# Patient Record
Sex: Male | Born: 1960 | Race: Black or African American | Hispanic: No | Marital: Single | Smoking: Former smoker
Health system: Southern US, Community
[De-identification: ages and names within clinical notes are randomized; demographics above are authoritative.]

## PROBLEM LIST (undated history)

## (undated) DIAGNOSIS — T7840XA Allergy, unspecified, initial encounter: Secondary | ICD-10-CM

## (undated) DIAGNOSIS — E7800 Pure hypercholesterolemia, unspecified: Secondary | ICD-10-CM

## (undated) DIAGNOSIS — I10 Essential (primary) hypertension: Secondary | ICD-10-CM

## (undated) HISTORY — DX: Pure hypercholesterolemia, unspecified: E78.00

## (undated) HISTORY — DX: Essential (primary) hypertension: I10

## (undated) HISTORY — DX: Allergy, unspecified, initial encounter: T78.40XA

---

## 2002-07-31 ENCOUNTER — Ambulatory Visit (HOSPITAL_COMMUNITY): Admission: RE | Admit: 2002-07-31 | Discharge: 2002-07-31 | Payer: Self-pay | Admitting: Family Medicine

## 2002-07-31 ENCOUNTER — Encounter: Payer: Self-pay | Admitting: Family Medicine

## 2004-01-13 ENCOUNTER — Emergency Department (HOSPITAL_COMMUNITY): Admission: EM | Admit: 2004-01-13 | Discharge: 2004-01-13 | Payer: Self-pay | Admitting: *Deleted

## 2004-03-17 ENCOUNTER — Emergency Department (HOSPITAL_COMMUNITY): Admission: EM | Admit: 2004-03-17 | Discharge: 2004-03-18 | Payer: Self-pay | Admitting: Emergency Medicine

## 2004-03-20 ENCOUNTER — Emergency Department (HOSPITAL_COMMUNITY): Admission: EM | Admit: 2004-03-20 | Discharge: 2004-03-20 | Payer: Self-pay | Admitting: Emergency Medicine

## 2004-03-31 ENCOUNTER — Emergency Department (HOSPITAL_COMMUNITY): Admission: EM | Admit: 2004-03-31 | Discharge: 2004-04-01 | Payer: Self-pay | Admitting: Emergency Medicine

## 2005-11-23 ENCOUNTER — Emergency Department (HOSPITAL_COMMUNITY): Admission: EM | Admit: 2005-11-23 | Discharge: 2005-11-24 | Payer: Self-pay | Admitting: Emergency Medicine

## 2008-06-01 ENCOUNTER — Emergency Department: Payer: Self-pay | Admitting: Internal Medicine

## 2008-08-10 ENCOUNTER — Ambulatory Visit: Payer: Self-pay | Admitting: Family Medicine

## 2009-04-25 ENCOUNTER — Emergency Department (HOSPITAL_COMMUNITY): Admission: EM | Admit: 2009-04-25 | Discharge: 2009-04-26 | Payer: Self-pay | Admitting: Emergency Medicine

## 2010-05-14 ENCOUNTER — Emergency Department (HOSPITAL_COMMUNITY): Admission: EM | Admit: 2010-05-14 | Discharge: 2010-05-15 | Payer: Self-pay | Admitting: Emergency Medicine

## 2010-09-10 ENCOUNTER — Ambulatory Visit: Payer: Self-pay | Admitting: Internal Medicine

## 2010-09-10 ENCOUNTER — Encounter (INDEPENDENT_AMBULATORY_CARE_PROVIDER_SITE_OTHER): Payer: Self-pay | Admitting: Family Medicine

## 2010-09-10 LAB — CONVERTED CEMR LAB
ALT: 14 units/L (ref 0–53)
AST: 20 units/L (ref 0–37)
Albumin: 4.5 g/dL (ref 3.5–5.2)
Alkaline Phosphatase: 90 units/L (ref 39–117)
BUN: 14 mg/dL (ref 6–23)
Basophils Absolute: 0 10*3/uL (ref 0.0–0.1)
Basophils Relative: 0 % (ref 0–1)
CO2: 29 meq/L (ref 19–32)
Calcium: 8.9 mg/dL (ref 8.4–10.5)
Chloride: 104 meq/L (ref 96–112)
Cholesterol: 212 mg/dL — ABNORMAL HIGH (ref 0–200)
Creatinine, Ser: 1.08 mg/dL (ref 0.40–1.50)
Eosinophils Absolute: 0.3 10*3/uL (ref 0.0–0.7)
Eosinophils Relative: 3 % (ref 0–5)
Glucose, Bld: 89 mg/dL (ref 70–99)
HCT: 43.7 % (ref 39.0–52.0)
HDL: 56 mg/dL (ref >39)
Hemoglobin: 14.4 g/dL (ref 13.0–17.0)
LDL Cholesterol: 141 mg/dL — ABNORMAL HIGH (ref 0–99)
Lymphocytes Relative: 34 % (ref 12–46)
Lymphs Abs: 3.1 10*3/uL (ref 0.7–4.0)
MCHC: 33 g/dL (ref 30.0–36.0)
MCV: 77.3 fL — ABNORMAL LOW (ref 78.0–100.0)
Monocytes Absolute: 0.9 10*3/uL (ref 0.1–1.0)
Monocytes Relative: 10 % (ref 3–12)
Neutro Abs: 4.9 10*3/uL (ref 1.7–7.7)
Neutrophils Relative %: 53 % (ref 43–77)
PSA: 0.59 ng/mL (ref 0.10–4.00)
Platelets: 154 10*3/uL (ref 150–400)
Potassium: 4.8 meq/L (ref 3.5–5.3)
RBC: 5.65 M/uL (ref 4.22–5.81)
RDW: 15.5 % (ref 11.5–15.5)
Sodium: 142 meq/L (ref 135–145)
Total Bilirubin: 0.5 mg/dL (ref 0.3–1.2)
Total CHOL/HDL Ratio: 3.8
Total Protein: 7 g/dL (ref 6.0–8.3)
Triglycerides: 76 mg/dL (ref <150)
VLDL: 15 mg/dL (ref 0–40)
WBC: 9.2 10*3/uL (ref 4.0–10.5)

## 2010-09-11 ENCOUNTER — Ambulatory Visit (HOSPITAL_COMMUNITY): Admission: RE | Admit: 2010-09-11 | Discharge: 2010-09-11 | Payer: Self-pay | Admitting: Internal Medicine

## 2011-03-15 LAB — URINE CULTURE
Colony Count: NO GROWTH
Culture: NO GROWTH

## 2011-03-15 LAB — POCT I-STAT, CHEM 8
BUN: 13 mg/dL (ref 6–23)
Calcium, Ion: 0.9 mmol/L — ABNORMAL LOW (ref 1.12–1.32)
Chloride: 104 mEq/L (ref 96–112)
Creatinine, Ser: 1.7 mg/dL — ABNORMAL HIGH (ref 0.4–1.5)
Glucose, Bld: 142 mg/dL — ABNORMAL HIGH (ref 70–99)
HCT: 50 % (ref 39.0–52.0)
Hemoglobin: 17 g/dL (ref 13.0–17.0)
Potassium: 3.6 mEq/L (ref 3.5–5.1)
Sodium: 136 mEq/L (ref 135–145)
TCO2: 20 mmol/L (ref 0–100)

## 2011-03-15 LAB — URINALYSIS, ROUTINE W REFLEX MICROSCOPIC
Bilirubin Urine: NEGATIVE
Glucose, UA: NEGATIVE mg/dL
Ketones, ur: NEGATIVE mg/dL
Leukocytes, UA: NEGATIVE
Nitrite: NEGATIVE
Protein, ur: NEGATIVE mg/dL
Specific Gravity, Urine: 1.01 (ref 1.005–1.030)
Urobilinogen, UA: 0.2 mg/dL (ref 0.0–1.0)
pH: 5.5 (ref 5.0–8.0)

## 2011-03-15 LAB — POCT CARDIAC MARKERS
CKMB, poc: 1.1 ng/mL (ref 1.0–8.0)
Myoglobin, poc: 479 ng/mL (ref 12–200)
Troponin i, poc: <0.05 ng/mL (ref 0.00–0.09)

## 2011-03-15 LAB — RAPID URINE DRUG SCREEN, HOSP PERFORMED
Amphetamines: NOT DETECTED
Barbiturates: NOT DETECTED
Benzodiazepines: NOT DETECTED
Cocaine: POSITIVE — AB
Opiates: NOT DETECTED
Tetrahydrocannabinol: NOT DETECTED

## 2011-03-15 LAB — TYPE AND SCREEN
ABO/RH(D): O POS
Antibody Screen: NEGATIVE

## 2011-03-15 LAB — ABO/RH: ABO/RH(D): O POS

## 2011-03-15 LAB — URINE MICROSCOPIC-ADD ON

## 2011-03-15 LAB — LACTIC ACID, PLASMA
Lactic Acid, Venous: 1.4 mmol/L (ref 0.5–2.2)
Lactic Acid, Venous: 6.9 mmol/L — ABNORMAL HIGH (ref 0.5–2.2)

## 2011-03-15 LAB — CK: Total CK: 554 U/L — ABNORMAL HIGH (ref 7–232)

## 2011-03-15 LAB — ETHANOL: Alcohol, Ethyl (B): 27 mg/dL — ABNORMAL HIGH (ref 0–10)

## 2011-04-06 LAB — ETHANOL: Alcohol, Ethyl (B): <5 mg/dL (ref 0–10)

## 2011-04-06 LAB — HEPATIC FUNCTION PANEL
ALT: 15 U/L (ref 0–53)
AST: 25 U/L (ref 0–37)
Albumin: 3.7 g/dL (ref 3.5–5.2)
Alkaline Phosphatase: 96 U/L (ref 39–117)
Bilirubin, Direct: <0.1 mg/dL (ref 0.0–0.3)
Total Bilirubin: 0.6 mg/dL (ref 0.3–1.2)
Total Protein: 6.5 g/dL (ref 6.0–8.3)

## 2011-04-07 LAB — POCT I-STAT, CHEM 8
Calcium, Ion: 1.16 mmol/L (ref 1.12–1.32)
Chloride: 100 mEq/L (ref 96–112)
Creatinine, Ser: 1.3 mg/dL (ref 0.4–1.5)
Glucose, Bld: 143 mg/dL — ABNORMAL HIGH (ref 70–99)
HCT: 49 % (ref 39.0–52.0)
Potassium: 3.6 mEq/L (ref 3.5–5.1)

## 2011-04-07 LAB — RAPID URINE DRUG SCREEN, HOSP PERFORMED
Amphetamines: NOT DETECTED
Benzodiazepines: NOT DETECTED
Cocaine: POSITIVE — AB
Tetrahydrocannabinol: NOT DETECTED

## 2011-05-05 ENCOUNTER — Emergency Department (HOSPITAL_COMMUNITY)
Admission: EM | Admit: 2011-05-05 | Discharge: 2011-05-06 | Disposition: A | Discharge status: Other Acute Inpatient Hospital | Payer: Self-pay | Attending: Emergency Medicine | Admitting: Emergency Medicine

## 2011-05-05 DIAGNOSIS — R45851 Suicidal ideations: Secondary | ICD-10-CM | POA: Insufficient documentation

## 2011-05-05 DIAGNOSIS — F329 Major depressive disorder, single episode, unspecified: Secondary | ICD-10-CM | POA: Insufficient documentation

## 2011-05-05 DIAGNOSIS — F1910 Other psychoactive substance abuse, uncomplicated: Secondary | ICD-10-CM | POA: Insufficient documentation

## 2011-05-05 DIAGNOSIS — F3289 Other specified depressive episodes: Secondary | ICD-10-CM | POA: Insufficient documentation

## 2011-05-05 LAB — RAPID URINE DRUG SCREEN, HOSP PERFORMED
Amphetamines: NOT DETECTED
Cocaine: POSITIVE — AB
Opiates: NOT DETECTED
Tetrahydrocannabinol: NOT DETECTED

## 2011-05-05 LAB — DIFFERENTIAL
Basophils Absolute: 0 10*3/uL (ref 0.0–0.1)
Basophils Relative: 0 % (ref 0–1)
Eosinophils Absolute: 0.2 10*3/uL (ref 0.0–0.7)
Monocytes Relative: 9 % (ref 3–12)
Neutro Abs: 4.7 10*3/uL (ref 1.7–7.7)
Neutrophils Relative %: 56 % (ref 43–77)

## 2011-05-05 LAB — COMPREHENSIVE METABOLIC PANEL
ALT: 16 U/L (ref 0–53)
AST: 26 U/L (ref 0–37)
Albumin: 3.9 g/dL (ref 3.5–5.2)
CO2: 28 mEq/L (ref 19–32)
Chloride: 103 mEq/L (ref 96–112)
Creatinine, Ser: 0.94 mg/dL (ref 0.4–1.5)
GFR calc Af Amer: >60 mL/min (ref >60)
GFR calc non Af Amer: >60 mL/min (ref >60)
Sodium: 140 mEq/L (ref 135–145)
Total Bilirubin: 0.7 mg/dL (ref 0.3–1.2)

## 2011-05-05 LAB — CBC
Hemoglobin: 14.5 g/dL (ref 13.0–17.0)
MCH: 24.7 pg — ABNORMAL LOW (ref 26.0–34.0)
Platelets: 138 10*3/uL — ABNORMAL LOW (ref 150–400)
RBC: 5.88 MIL/uL — ABNORMAL HIGH (ref 4.22–5.81)
WBC: 8.4 10*3/uL (ref 4.0–10.5)

## 2011-05-06 ENCOUNTER — Inpatient Hospital Stay (HOSPITAL_COMMUNITY)
Admission: AD | Admit: 2011-05-06 | Discharge: 2011-05-11 | DRG: 897 | Disposition: A | Discharge status: Home / Self Care | Payer: PRIVATE HEALTH INSURANCE | Source: Ambulatory Visit | Attending: Psychiatry | Admitting: Psychiatry

## 2011-05-06 DIAGNOSIS — R05 Cough: Secondary | ICD-10-CM

## 2011-05-06 DIAGNOSIS — R45851 Suicidal ideations: Secondary | ICD-10-CM

## 2011-05-06 DIAGNOSIS — J309 Allergic rhinitis, unspecified: Secondary | ICD-10-CM | POA: Diagnosis present

## 2011-05-06 DIAGNOSIS — R748 Abnormal levels of other serum enzymes: Secondary | ICD-10-CM | POA: Diagnosis present

## 2011-05-06 DIAGNOSIS — F1420 Cocaine dependence, uncomplicated: Principal | ICD-10-CM | POA: Diagnosis present

## 2011-05-07 DIAGNOSIS — F1410 Cocaine abuse, uncomplicated: Secondary | ICD-10-CM

## 2011-05-11 ENCOUNTER — Ambulatory Visit (HOSPITAL_COMMUNITY): Payer: PRIVATE HEALTH INSURANCE | Attending: Psychiatry

## 2011-05-11 DIAGNOSIS — R059 Cough, unspecified: Secondary | ICD-10-CM | POA: Insufficient documentation

## 2011-05-11 DIAGNOSIS — I10 Essential (primary) hypertension: Secondary | ICD-10-CM | POA: Insufficient documentation

## 2011-05-11 DIAGNOSIS — R05 Cough: Secondary | ICD-10-CM | POA: Insufficient documentation

## 2011-05-11 DIAGNOSIS — F17200 Nicotine dependence, unspecified, uncomplicated: Secondary | ICD-10-CM | POA: Insufficient documentation

## 2011-05-14 NOTE — Discharge Summary (Signed)
  [name], [name] NO.:  [id number]  MEDICAL RECORD NO.:  [id number]           PATIENT TYPE:  I  LOCATION:  0303                          FACILITY:  [facility]  PHYSICIAN:  Kristopher Jungling, MD  DATE OF BIRTH:  12-Mar-1961  DATE OF ADMISSION:  05/06/2011 DATE OF DISCHARGE:  05/11/2011                              DISCHARGE SUMMARY   IDENTIFYING DATA/REASON FOR ADMISSION:  This was an inpatient psychiatric admission for Kristopher [name], a 50 year old male who was admitted at his own request because of issues of polysubstance dependence, associated with suicidal ideation.  He also had multiple recent stressors.  Please refer to the admission note for further details pertaining to the symptoms, circumstances and history that led to his hospitalization.  He was given an initial Axis I diagnosis of polysubstance dependence.  MEDICAL AND LABORATORY:  The patient was medically and physically assessed by the psychiatric nurse practitioner.  He appeared to be in good health without any active or chronic medical problems.  There were no significant medical issues during his stay.  HOSPITAL COURSE:  The patient was admitted to the adult inpatient psychiatric service.  He presented as a well-nourished, normally- developed adult male who was generally pleasant and cooperative.  He was able to describe with good clarity and articulation the consequences of his substance abuse, including losing his job and concerns regarding addiction.  His primary drug of abuse had been cocaine, so he did not require any medical detoxification.  However, he participated in therapeutic groups and activities geared towards helping him acquire a head start on his recovery process, and these included 12-step groups.  He worked closely with case management towards an aftercare plan.  On hospital day #6, he was appropriate for discharge, having been accepted at the [facility] program further chemical  dependency treatment.  AFTERCARE:  The patient was to go to the [facility] program on the day following admission, May 12, 2011.  The only discharge medication was Claritin 10 mg daily for seasonal allergies.  DISCHARGE DIAGNOSIS:  AXIS I:  Polysubstance dependence. AXIS II:  Deferred. AXIS III:  Seasonal allergies. AXIS IV:  Stressors severe. AXIS V:  GAF on discharge 50.     Kristopher Jungling, MD     SPB/MEDQ  D:  05/13/2011  T:  05/13/2011  Job:  [id number]  Electronically Signed by Geralyn Flash MD on 05/14/2011 08:54:46 AM

## 2011-06-10 NOTE — H&P (Signed)
NAME:  Kristopher [name], Kristopher [name] NO.:  [id number]  MEDICAL RECORD NO.:  [id number]           PATIENT TYPE:  I  LOCATION:  0303                          FACILITY:  [facility]  PHYSICIAN:  Anselm Jungling, MD  DATE OF BIRTH:  26-Sep-1961  DATE OF ADMISSION:  05/06/2011 DATE OF DISCHARGE:                      PSYCHIATRIC ADMISSION ASSESSMENT   IDENTIFYING INFORMATION:  The patient is a 50 year old, African American male who presented to the [facility] emergency room complaining of increased depression and suicidal thoughts secondary to relapse in use of alcohol and cocaine.  PAST PSYCHIATRIC HISTORY:  The patient had been treated in a residential facility in the past, [facility] in 2010.  He also endorses inpatient treatment in [facility] and outpatient treatment in 2011.  He is single, a native of [place].  Recently lost his home and job.  He has no children.  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  He is currently facing probable charges from the [facility] for driving violations.  He has served time in the past for breaking and entering and drug trafficking, where he served 8 to 10 months.  FAMILY PSYCHIATRIC HISTORY:  He endorses first cousin is schizophrenic.  FAMILY ALCOHOL AND DRUG HISTORY:  Father and brother and uncles all drink alcohol.  His brother died from substance-induced coronary artery disease and alcohol.  He has an uncle who has problems with alcohol. The patient endorses his last use of alcohol was 3 to 4 days ago when he drank three 40 ounce beers.  He has been drinking 2 to 3 days a week for the last 5 months.  He says he has been using on average a gram of cocaine every day for the past 4 months, his last use was 4 days ago.  PRIMARY CARE PROVIDER:  None.  MEDICAL PROBLEMS:  He denies any  MEDICATIONS:  None.  DRUG ALLERGIES:  HE IS ALLERGIC TO TB SKIN TEST.  PHYSICAL EXAMINATION:   Examination was done in the [facility] emergency room by PA, Kelvin Cellar, and was unremarkable.  SIGNIFICANT LAB RESULTS:  Include a CBC with diff which indicated a red blood count on the differential was 5.88, MCV is low at 72.8, MCH low at 24.7.  Platelet count is low at 138.  The remainder is normal. Comprehensive metabolic profile indicates glucose of 149 and alkaline phosphatase of 118.  SGOT, SGPT and the remainder of labs were normal. Alcohol level was negative less than 11.  Urine drug screen was positive for cocaine use.  PSYCHIATRIC EXAM:  A well-developed, well-nourished, African American male who appears his chronologically stated age of 50 years old, partially graying hair, currently braided.  He is alert and oriented x4. He does endorse increasing depression with suicidal ideation increasing over the last several days, but none today.  He is cooperative and pleasant in his demeanor.  He makes good eye contact.  His speech is clear, coherent and goal oriented.  Mood is depressed.  Affect is congruent.  He smiles easily and can be quite charming.  Thought process is  normal and linear without evidence of psychosis.  Cognition is at least average.  ASSESSMENT:  Axis I:  Cocaine dependency. Axis II:  Negative. Axis III:  Negative.  Elevated alkaline phosphatase. Axis IV:  Current legal problems, problems with occupation and housing. Axis V:  Current Global Assessment of Functioning 48, past year unknown.  PLAN:  Admission with possible transfer to the mood disorder floor. Estimated length of stay 1-3 days.  Continue collateral information.    ______________________________ Verne Spurr, PA   ______________________________ Anselm Jungling, MD    NM/MEDQ  D:  05/07/2011  T:  05/07/2011  Job:  [id number]  Electronically Signed by Nelly Rout MD on 06/10/2011 11:08:54 AM

## 2012-01-30 IMAGING — US US ABDOMEN COMPLETE
1 series · 14 of 25 positions shown · non-contrast
Comparison: None.

CLINICAL DATA: Hepatomegaly on physical exam.  Alcohol abuse.

ABDOMINAL ULTRASOUND COMPLETE

[Series 1: us abdomen complete · 0.21mm/px · 14 of 80 slices shown]
[im 1/80]
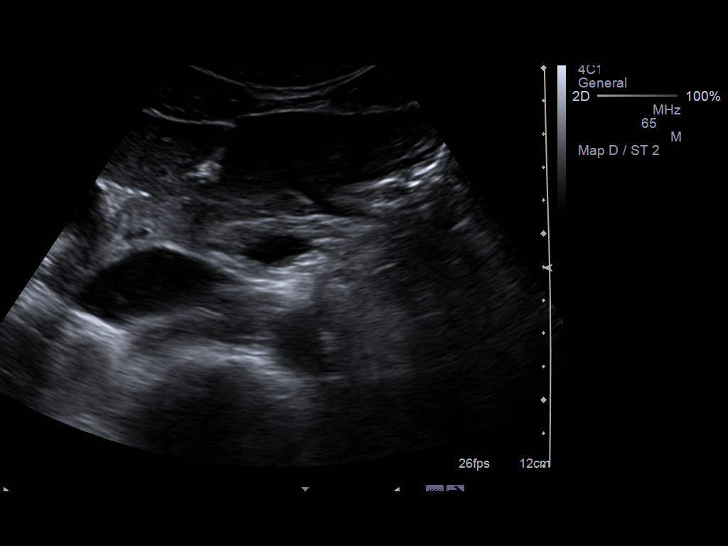
[im 7/80]
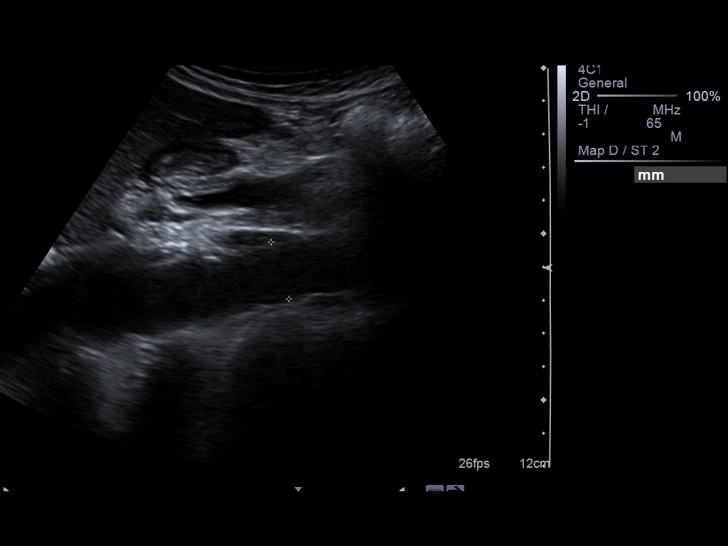
[im 14/80]
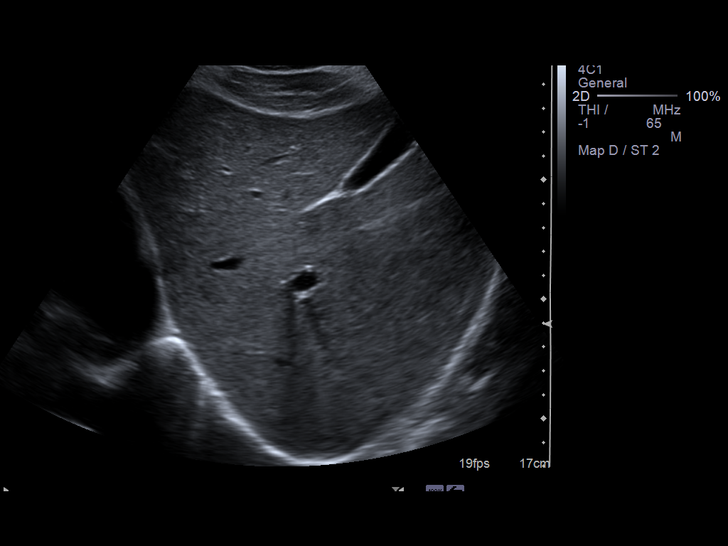
[im 20/80]
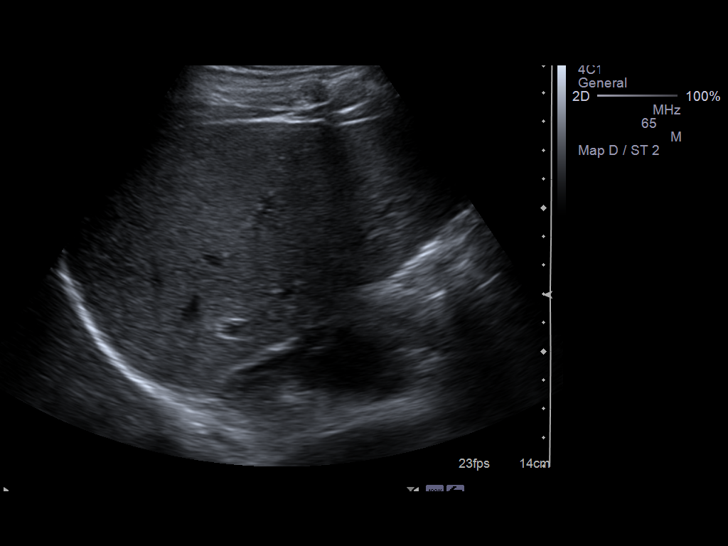
[im 27/80]
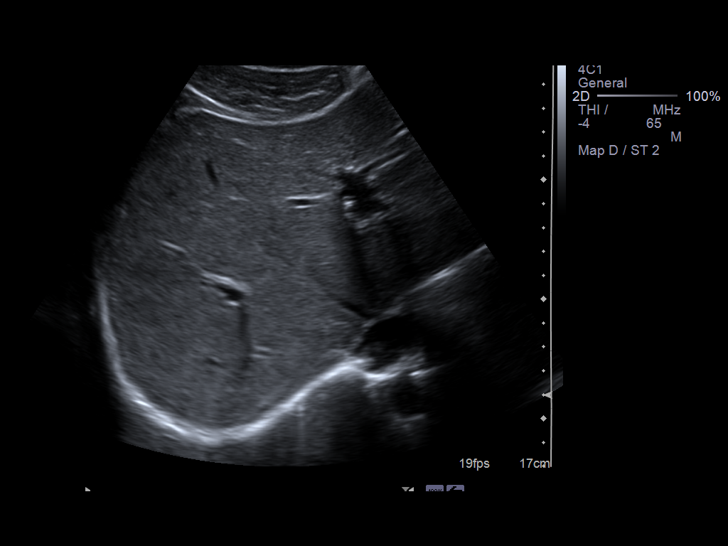
[im 30/80]
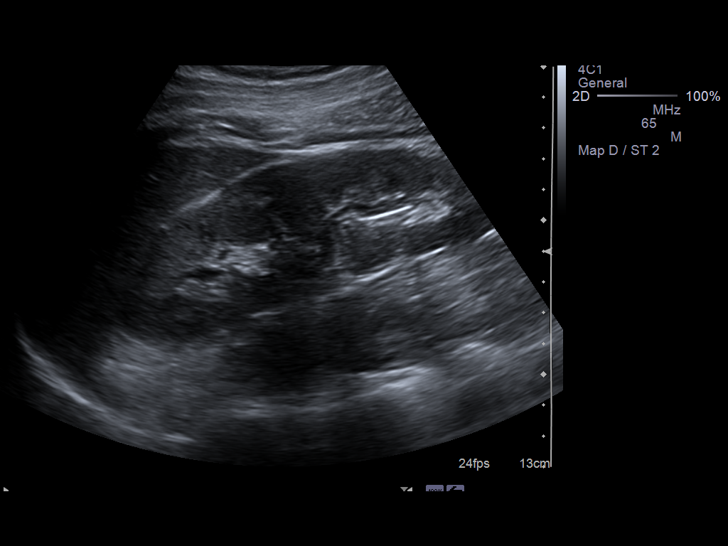
[im 37/80]
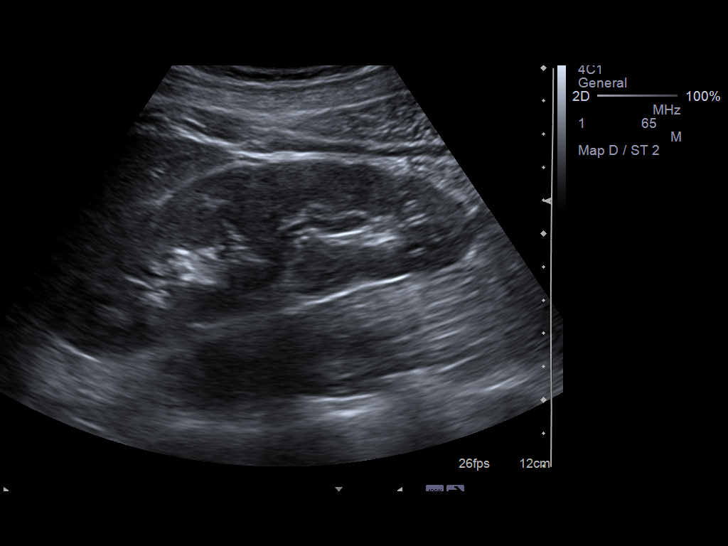
[im 43/80]
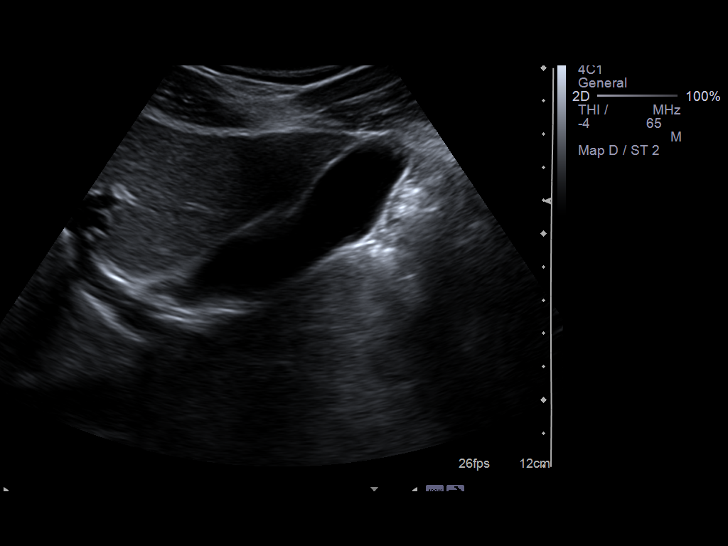
[im 50/80]
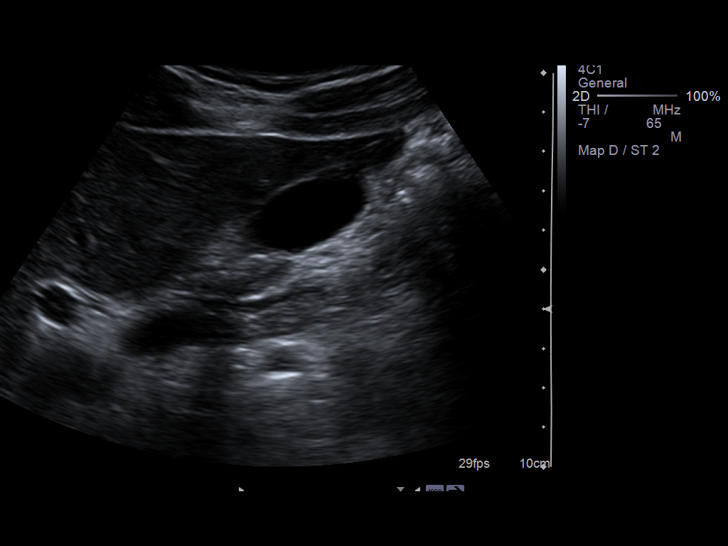
[im 53/80]
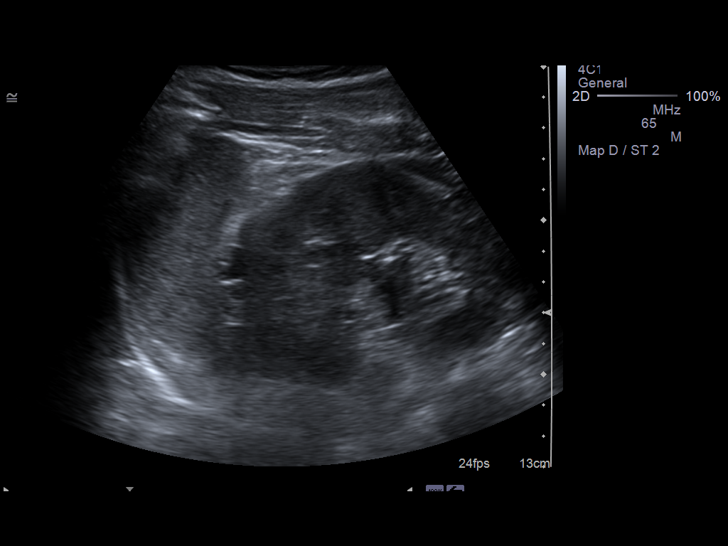
[im 60/80]
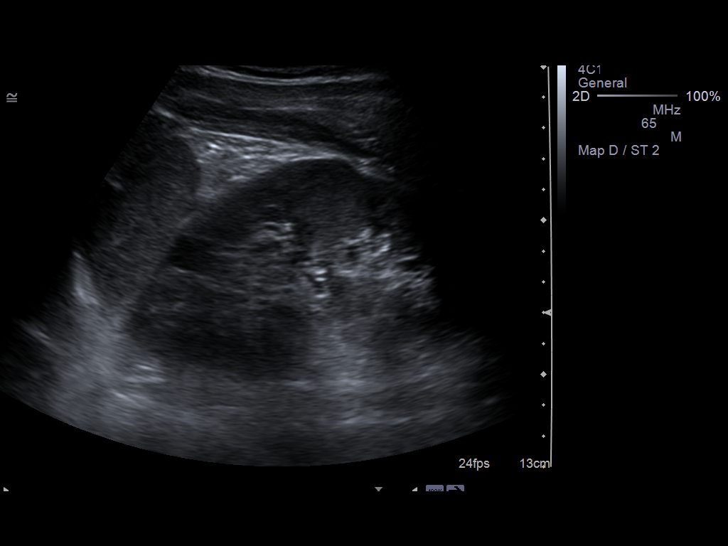
[im 66/80]
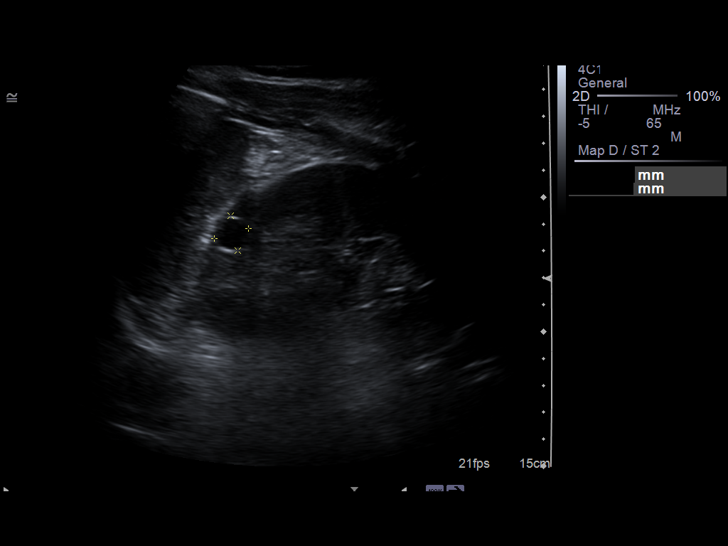
[im 73/80]
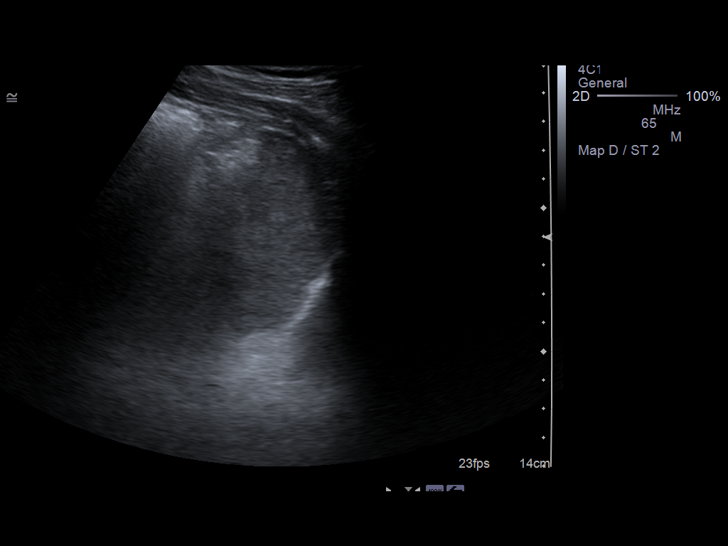
[im 80/80]
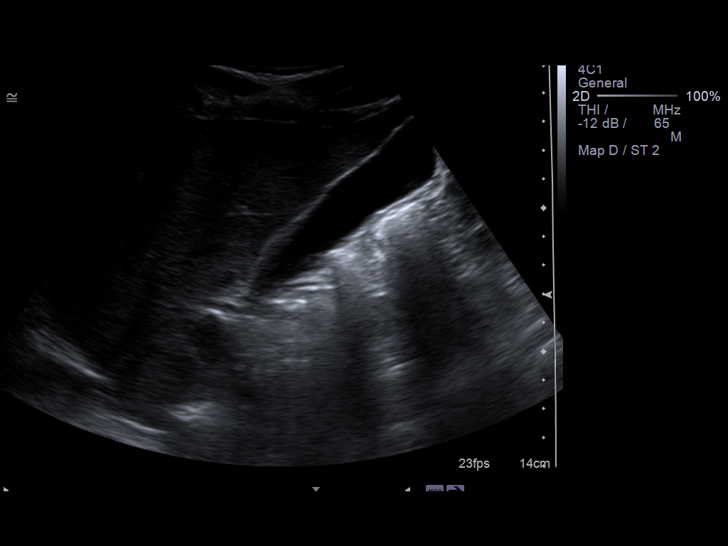

[14 of 25 positions shown; findings below may reference images not displayed]

FINDINGS: Gallbladder:  No gallstones, gallbladder wall thickening, or
pericholecystic fluid.

Common Bile Duct:  Within normal limits in caliber.

Liver: No focal mass lesion identified.  Within normal limits in
parenchymal echogenicity.

IVC:  Appears normal.

Pancreas:  Head and body appear normal.  Tail obscured by bowel
gas.

Spleen:  4.6 cm.  Normal.

Right kidney:  Normal in size and parenchymal echogenicity.  No
evidence of mass or hydronephrosis.

Left kidney:  Normal in size and parenchymal echogenicity.  Simple
appearing left upper pole cyst measures 1.3 x 1.3 x 1.2 cm.  No
evidence of solid mass or hydronephrosis.

Abdominal Aorta:  No aneurysm identified.
IMPRESSION: Negative abdominal ultrasound.

## 2012-09-28 IMAGING — CR DG CHEST 2V
2 series · 2 of 2 positions shown · non-contrast
Comparison: 05/14/2010

CLINICAL DATA: Cough.  Hypertension.  Smoker.

CHEST - 2 VIEW

[w chest pa]
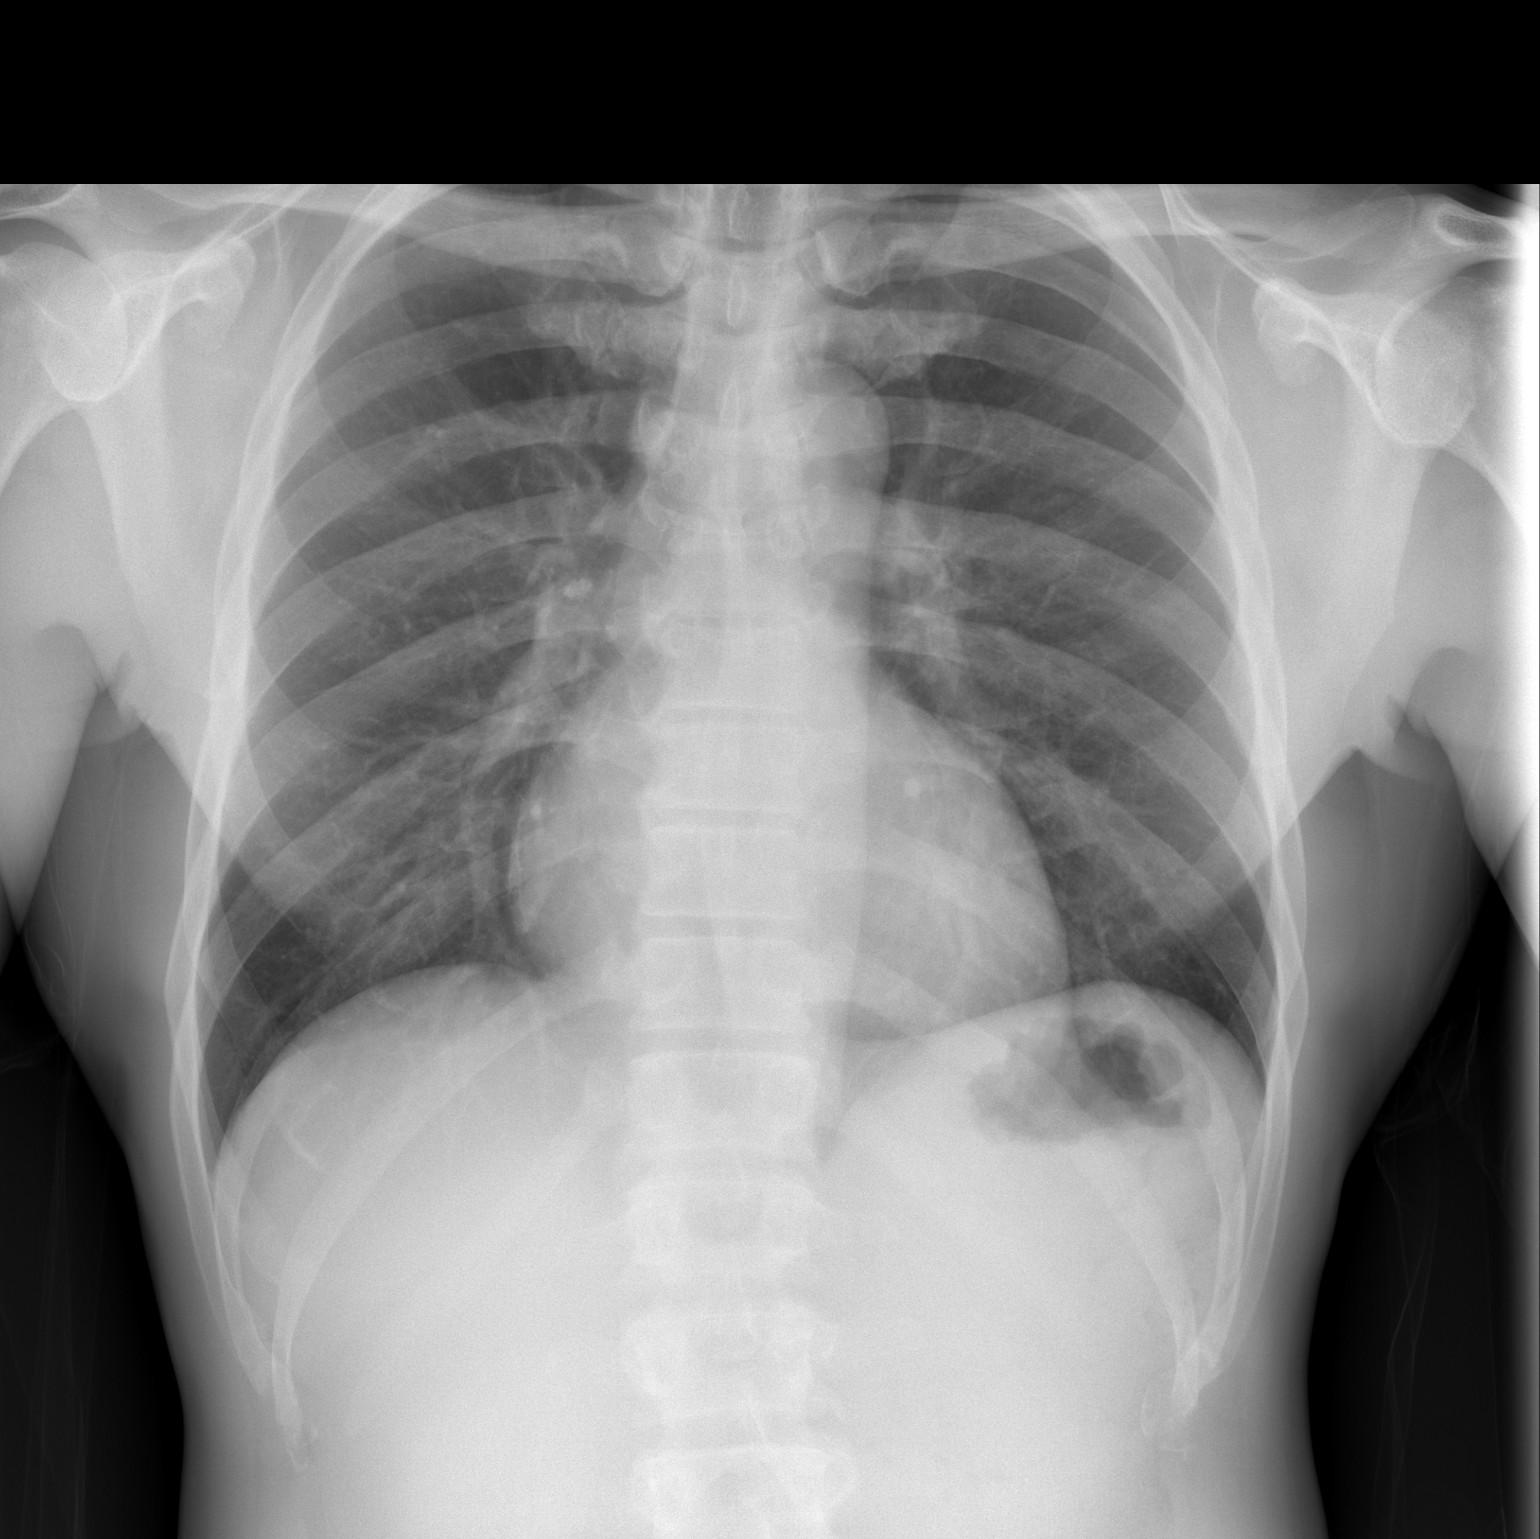

[w chest lat]
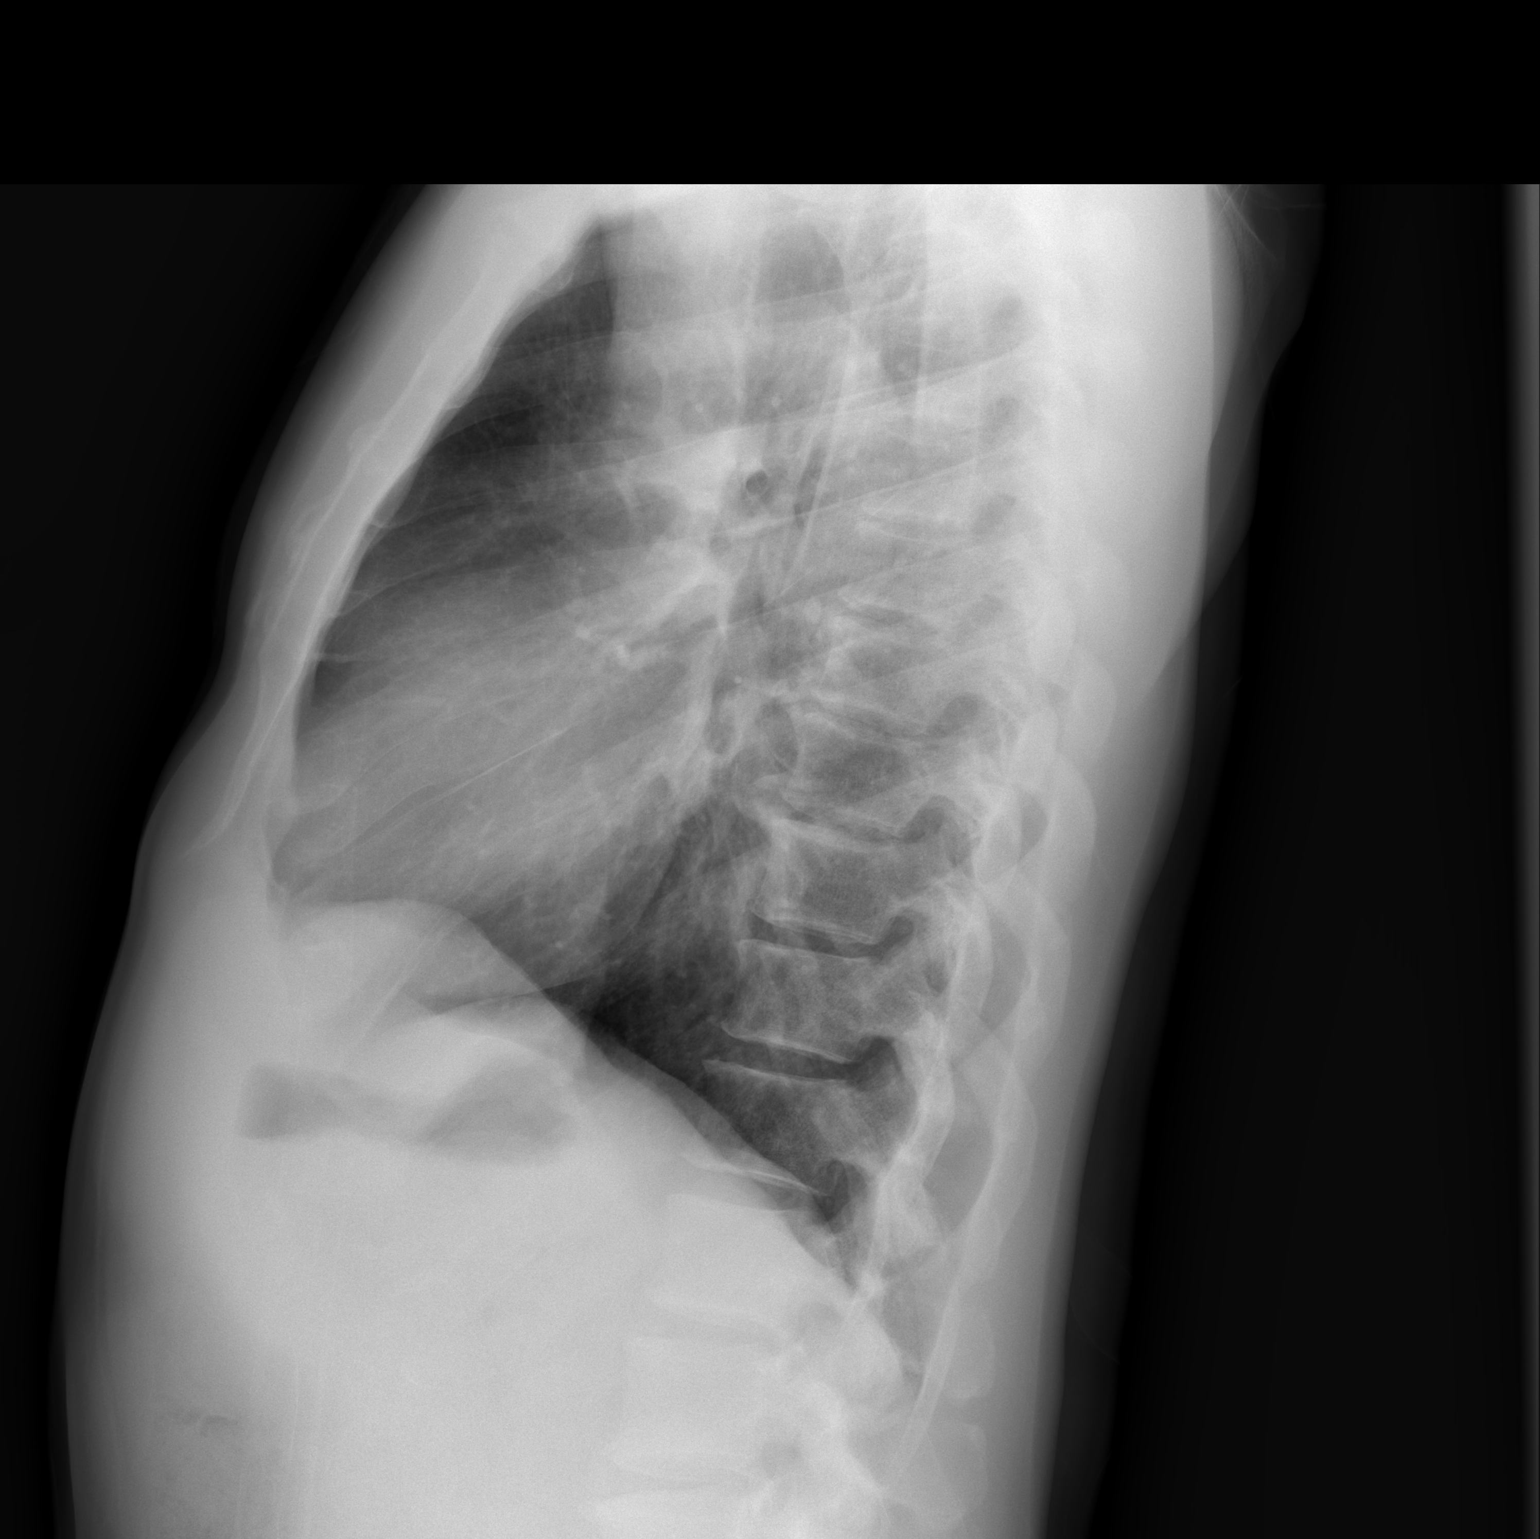

[2 of 2 positions shown; findings below may reference images not displayed]

FINDINGS: Heart size is normal.  Both lungs are clear.  No evidence
of pleural effusion.  No mass or lymphadenopathy identified.  No
significant change compared to prior exam.
IMPRESSION: Stable exam.  No active disease.

## 2015-09-05 ENCOUNTER — Institutional Professional Consult (permissible substitution): Payer: PRIVATE HEALTH INSURANCE | Admitting: Pulmonary Disease

## 2015-10-29 ENCOUNTER — Encounter: Payer: Self-pay | Admitting: Pulmonary Disease

## 2015-10-29 ENCOUNTER — Ambulatory Visit (INDEPENDENT_AMBULATORY_CARE_PROVIDER_SITE_OTHER): Payer: 59 | Admitting: Pulmonary Disease

## 2015-10-29 VITALS — BP 136/86 | HR 59 | Ht 71.0 in | Wt 185.4 lb

## 2015-10-29 DIAGNOSIS — R0683 Snoring: Secondary | ICD-10-CM

## 2015-10-29 NOTE — Progress Notes (Signed)
Chief Complaint  Patient presents with  . SLEEP CONSULT    Pt referred by Dr. Marcelino Duster for snoring. pt states he wakes up often throughout the night. pt states other people has told him his breathing is very spiratic and he stops breathing. Epworth Score: 11    History of Present Illness: Kristopher [name] is a 54 y.o. male for evaluation of sleep problems.  He has history of snoring for years.  This has been getting worse.  He has been told that he stops breathing while asleep, and this gets worse on his back.  He wakes up frequently to change his position.    He goes to sleep between 10 pm and midnight.  He falls asleep after 10 minutes.  He wakes up at least once to use the bathroom.  He gets out of bed between 6 and 730 am.  He feels tired in the morning, and could stay in bed longer if was able to.  He denies morning headache.  He does not use anything to help him fall sleep.  He drinks at least two cups of coffee to help stay awake.  He denies sleep walking, sleep talking, bruxism, or nightmares.  There is no history of restless legs.  He denies sleep hallucinations, sleep paralysis, or cataplexy.  The Epworth score is 11 out of 24.  Tests:   Kristopher [name]  has a past medical history of Hypertension; High cholesterol; and Allergy.  Kristopher [name]  has no past surgical history on file.  Prior to Admission medications   Medication Sig Start Date End Date Taking? Authorizing Provider  aspirin 81 MG tablet Take 81 mg by mouth daily.   Yes Historical Provider, MD  hydrochlorothiazide (HYDRODIURIL) 25 MG tablet Take 25 mg by mouth daily.   Yes Historical Provider, MD  Loratadine 10 MG CAPS Take 1 capsule by mouth daily.   Yes Historical Provider, MD    Allergies  Allergen Reactions  . Tuberculin Tests     Will test false positive     His family history includes Sleep apnea in his brother and father.  He  reports that he quit smoking about 3 years ago. He does not have  any smokeless tobacco history on file.   Physical Exam: BP 136/86 mmHg  Pulse 59  Ht  (1.803 m)  Wt 185 lb 6.4 oz (84.097 kg)  BMI 25.87 kg/m2  SpO2 100%  General - No distress ENT - No sinus tenderness, no oral exudate, no LAN, no thyromegaly, TM clear, pupils equal/reactive, MP 4, scalloped tongue, upper dentures Cardiac - s1s2 regular, no murmur, pulses symmetric Chest - No wheeze/rales/dullness, good air entry, normal respiratory excursion Back - No focal tenderness Abd - Soft, non-tender, no organomegaly, + bowel sounds Ext - No edema Neuro - Normal strength, cranial nerves intact Skin - No rashes Psych - Normal mood, and behavior  Discussion: He has snoring, sleep disruption, witnessed apnea, and daytime sleepiness.  He has hx of HTN and family hx of OSA.  I am concerned he could have sleep apnea also.  We discussed how sleep apnea can affect various health problems including risks for hypertension, cardiovascular disease, and diabetes.  We also discussed how sleep disruption can increase risks for accident, such as while driving.  Weight loss as a means of improving sleep apnea was also reviewed.  Additional treatment options discussed were CPAP therapy, oral appliance, and surgical intervention.  Assessment/plan:  Snoring. Plan: - will  arrange for home sleep study pending insurance approval   Coralyn HellingVineet Hisham Provence, M.D. Pager [phone]

## 2015-10-29 NOTE — Progress Notes (Signed)
   Subjective:    Patient ID: Jodi GeraldsGregory J Chamberlain, male    DOB: Aug 25, 1961, 54 y.o.   MRN: [id number]  HPI    Review of Systems  Constitutional: Negative for fever and unexpected weight change.  HENT: Positive for congestion. Negative for dental problem, ear pain, nosebleeds, postnasal drip, rhinorrhea, sinus pressure, sneezing, sore throat and trouble swallowing.   Eyes: Negative for redness and itching.  Respiratory: Negative for cough, chest tightness, shortness of breath and wheezing.   Cardiovascular: Negative for palpitations and leg swelling.  Gastrointestinal: Negative for nausea and vomiting.  Genitourinary: Negative for dysuria.  Musculoskeletal: Negative for joint swelling.  Skin: Negative for rash.  Neurological: Negative for headaches.  Hematological: Does not bruise/bleed easily.  Psychiatric/Behavioral: Negative for dysphoric mood. The patient is not nervous/anxious.        Objective:   Physical Exam        Assessment & Plan:

## 2015-10-29 NOTE — Patient Instructions (Signed)
Will arrange for home sleep study Will call to arrange for follow up after sleep study reviewed  

## 2015-11-12 DIAGNOSIS — G4733 Obstructive sleep apnea (adult) (pediatric): Secondary | ICD-10-CM | POA: Diagnosis not present

## 2015-11-24 ENCOUNTER — Telehealth: Payer: Self-pay | Admitting: Pulmonary Disease

## 2015-11-24 NOTE — Telephone Encounter (Signed)
HST 11/12/15 >> AHI 29.5, SaO2 low 80%.  Will have my nurse inform pt that sleep study shows moderate to severe sleep apnea.  Options are 1) CPAP now, 2) ROV first.  If He is agreeable to CPAP, then please send order for auto CPAP range 5 to 15 cm H2O with heated humidity and mask of choice.  Have download sent 1 month after starting CPAP and set up ROV 2 months after starting CPAP.

## 2015-11-25 DIAGNOSIS — G4733 Obstructive sleep apnea (adult) (pediatric): Secondary | ICD-10-CM | POA: Diagnosis not present

## 2015-11-25 NOTE — Telephone Encounter (Signed)
lmtcb X1 for pt  

## 2015-11-26 NOTE — Telephone Encounter (Signed)
Contacted pt with results per SN Pt would like to come back in to review sleep study before starting CPAP  appt made with TP for 12/15/15 at 9:15 am  Nothing further needed

## 2015-11-27 ENCOUNTER — Encounter: Payer: Self-pay | Admitting: Pulmonary Disease

## 2015-12-03 ENCOUNTER — Other Ambulatory Visit: Payer: Self-pay | Admitting: *Deleted

## 2015-12-03 DIAGNOSIS — R0683 Snoring: Secondary | ICD-10-CM

## 2015-12-15 ENCOUNTER — Encounter: Payer: Self-pay | Admitting: Adult Health

## 2015-12-15 ENCOUNTER — Ambulatory Visit (INDEPENDENT_AMBULATORY_CARE_PROVIDER_SITE_OTHER): Payer: 59 | Admitting: Adult Health

## 2015-12-15 ENCOUNTER — Telehealth: Payer: Self-pay | Admitting: Pulmonary Disease

## 2015-12-15 VITALS — BP 130/80 | HR 64 | Temp 98.2°F | Ht 71.0 in | Wt 193.0 lb

## 2015-12-15 DIAGNOSIS — G4733 Obstructive sleep apnea (adult) (pediatric): Secondary | ICD-10-CM | POA: Diagnosis not present

## 2015-12-15 NOTE — Addendum Note (Signed)
Addended by: Karalee HeightOX, Gloriajean Okun P on: 12/15/2015 10:00 AM   Modules accepted: Orders

## 2015-12-15 NOTE — Patient Instructions (Signed)
Begin nocturnal C Pap. Goal is to wear for at least 4-6 hours each night. Do not driving if sleepy. We will do a C Pap download in one month. Follow-up in 2 months with Dr. Craige CottaSood  and as needed

## 2015-12-15 NOTE — Assessment & Plan Note (Signed)
Moderate to severe sleep apnea  Plan  Begin nocturnal C Pap. Goal is to wear for at least 4-6 hours each night. Do not driving if sleepy. We will do a C Pap download in one month. Follow-up in 2 months with Dr. Craige CottaSood  and as needed

## 2015-12-15 NOTE — Progress Notes (Signed)
Reviewed and agree with assessment/plan. 

## 2015-12-15 NOTE — Telephone Encounter (Signed)
Dr Craige CottaSood has openings in February -- Friday 02/20/16 -------- Pt unable to schedule on Fridays, scheduled for 02/25/16 at 2:15 Nothing further needed.

## 2015-12-15 NOTE — Progress Notes (Signed)
Subjective:    Patient ID: Kristopher [name], male    DOB: December 12, 1961, 54 y.o.   MRN: [id number]  HPI 54 yo male seen for a sleep consult 10/29/2015 with Dr. Craige Cotta  .   TEST  HST 11/12/15 >AHI 29.5, SaO2 low 80%  12/15/2015 Follow up : OSA  Patient returns for 1 month follow-up to discuss sleep study. Patient was refluxing for a sleep consult last month.. He underwent home sleep study on November 16 that showed moderate to severe sleep apnea with an AHI 29.5, SaO2 to low 80% on home sleep study on November 16.. We discussed his sleep study results. We did reviewed treatment options including  C Pap. Patient would like to proceed with nocturnal C Pap. Denies any chest pain, shortness of breath, orthopnea, PND or leg swelling   Past Medical History  Diagnosis Date  . Hypertension   . High cholesterol   . Allergy    Current Outpatient Prescriptions on File Prior to Visit  Medication Sig Dispense Refill  . aspirin 81 MG tablet Take 81 mg by mouth daily.    . hydrochlorothiazide (HYDRODIURIL) 25 MG tablet Take 25 mg by mouth daily.    . Loratadine 10 MG CAPS Take 1 capsule by mouth daily.     No current facility-administered medications on file prior to visit.    Review of Systems Constitutional:   No  weight loss, night sweats,  Fevers, chills, fatigue, or  lassitude.  HEENT:   No headaches,  Difficulty swallowing,  Tooth/dental problems, or  Sore throat,                No sneezing, itching, ear ache, nasal congestion, post nasal drip,   CV:  No chest pain,  Orthopnea, PND, swelling in lower extremities, anasarca, dizziness, palpitations, syncope.   GI  No heartburn, indigestion, abdominal pain, nausea, vomiting, diarrhea, change in bowel habits, loss of appetite, bloody stools.   Resp: No shortness of breath with exertion or at rest.  No excess mucus, no productive cough,  No non-productive cough,  No coughing up of blood.  No change in color of mucus.  No wheezing.  No  chest wall deformity  Skin: no rash or lesions.  GU: no dysuria, change in color of urine, no urgency or frequency.  No flank pain, no hematuria   MS:  No joint pain or swelling.  No decreased range of motion.  No back pain.  Psych:  No change in mood or affect. No depression or anxiety.  No memory loss.         Objective:   Physical Exam  Filed Vitals:   12/15/15 0933  BP: 130/80  Pulse: 64  Temp: 98.2 F (36.8 C)  TempSrc: Oral  Height:  (1.803 m)  Weight: 193 lb (87.544 kg)  SpO2: 97%     GEN: A/Ox3; pleasant , NAD   HEENT:  [place]/AT,  EACs-clear, TMs-wnl, NOSE-clear, THROAT-clear, no lesions, no postnasal drip or exudate noted.   NECK:  Supple w/ fair ROM; no JVD; normal carotid impulses w/o bruits; no thyromegaly or nodules palpated; no lymphadenopathy.  RESP  Clear  P & A; w/o, wheezes/ rales/ or rhonchi.no accessory muscle use, no dullness to percussion  CARD:  RRR, no m/r/g  , no peripheral edema, pulses intact, no cyanosis or clubbing.  GI:   Soft & nt; nml bowel sounds; no organomegaly or masses detected.  Musco: Warm bil, no deformities or joint swelling noted.  Neuro: alert, no focal deficits noted.    Skin: Warm, no lesions or rashes        Assessment & Plan:

## 2016-02-25 ENCOUNTER — Ambulatory Visit: Payer: Self-pay | Admitting: Pulmonary Disease

## 2020-04-22 ENCOUNTER — Encounter (HOSPITAL_COMMUNITY): Payer: Self-pay | Admitting: Emergency Medicine

## 2020-04-22 ENCOUNTER — Other Ambulatory Visit: Payer: Self-pay

## 2020-04-22 ENCOUNTER — Ambulatory Visit (HOSPITAL_COMMUNITY)
Admission: EM | Admit: 2020-04-22 | Discharge: 2020-04-22 | Disposition: A | Discharge status: Home / Self Care | Payer: PRIVATE HEALTH INSURANCE | Attending: Family Medicine | Admitting: Family Medicine

## 2020-04-22 DIAGNOSIS — S61211A Laceration without foreign body of left index finger without damage to nail, initial encounter: Secondary | ICD-10-CM

## 2020-04-22 DIAGNOSIS — Z23 Encounter for immunization: Secondary | ICD-10-CM | POA: Diagnosis not present

## 2020-04-22 MED ORDER — LIDOCAINE HCL 2 % IJ SOLN
INTRAMUSCULAR | Status: AC
  Filled 2020-04-22: qty 20

## 2020-04-22 MED ORDER — TETANUS-DIPHTH-ACELL PERTUSSIS 5-2.5-18.5 LF-MCG/0.5 IM SUSP
0.5000 mL | Freq: Once | INTRAMUSCULAR | Status: AC
  Administered 2020-04-22: 20:00:00 0.5 mL via INTRAMUSCULAR

## 2020-04-22 MED ORDER — TETANUS-DIPHTH-ACELL PERTUSSIS 5-2.5-18.5 LF-MCG/0.5 IM SUSP
INTRAMUSCULAR | Status: AC
  Filled 2020-04-22: qty 0.5

## 2020-04-22 NOTE — Discharge Instructions (Addendum)
Elevate to reduce pain and swelling Take Tylenol or ibuprofen for pain Watch for signs of infection keep clean and reasonably dry  Return for suture removal in 10 days

## 2020-04-22 NOTE — ED Provider Notes (Signed)
[facility]    CSN: [id number] Arrival date & time: 04/22/20  7793      History   Chief Complaint Chief Complaint  Patient presents with  . Laceration    HPI Kristopher [name] is a 59 y.o. male.   HPI  Accidentally cut his left index finger with hedge clippers Has a macerated laceration on the palmar aspect of his index finger over the middle phalanx No loss of sensation No loss of movement Desires repair Unknown tetanus  Past Medical History:  Diagnosis Date  . Allergy   . High cholesterol   . Hypertension     Patient Active Problem List   Diagnosis Date Noted  . OSA (obstructive sleep apnea) 12/15/2015    History reviewed. No pertinent surgical history.     Home Medications    Prior to Admission medications   Medication Sig Start Date End Date Taking? Authorizing Provider  aspirin 81 MG tablet Take 81 mg by mouth daily.    [provider]  hydrochlorothiazide (HYDRODIURIL) 25 MG tablet Take 25 mg by mouth daily.    [provider]  Loratadine 10 MG CAPS Take 1 capsule by mouth daily.    [provider]    Family History Family History  Problem Relation Age of Onset  . Sleep apnea Father   . Sleep apnea Brother     Social History Social History   Tobacco Use  . Smoking status: Former Smoker    Quit date: 10/28/2012    Years since quitting: 7.4  . Smokeless tobacco: Never Used  Substance Use Topics  . Alcohol use: Not on file  . Drug use: Not on file     Allergies   Tuberculin tests   Review of Systems Review of Systems  Skin: Positive for wound.     Physical Exam Triage Vital Signs ED Triage Vitals  Enc Vitals Group     BP 04/22/20 1917 (!) 180/106     Pulse Rate 04/22/20 1917 (!) 56     Resp 04/22/20 1917 16     Temp 04/22/20 1917 99.7 F (37.6 C)     Temp Source 04/22/20 1917 Oral     SpO2 04/22/20 1917 97 %     Weight --      Height --      Head Circumference --      Peak Flow --       Pain Score 04/22/20 1916 6     Pain Loc --      Pain Edu? --      Excl. in GC? --    No data found.  Updated Vital Signs BP (!) 180/106   Pulse (!) 56   Temp 99.7 F (37.6 C) (Oral)   Resp 16   SpO2 97%  Patient notified of elevated blood pressure.  He will follow up with his primary care doctor     Physical Exam Constitutional:      General: He is not in acute distress.    Appearance: He is well-developed.  HENT:     Head: Normocephalic and atraumatic.     Mouth/Throat:     Comments: Mask is in place Eyes:     Conjunctiva/sclera: Conjunctivae normal.     Pupils: Pupils are equal, round, and reactive to light.  Cardiovascular:     Rate and Rhythm: Normal rate.  Pulmonary:     Effort: Pulmonary effort is normal. No respiratory distress.  Musculoskeletal:  General: Normal range of motion.     Cervical back: Normal range of motion.  Skin:    General: Skin is warm and dry.     Comments: Index finger of left hand has a C-shaped laceration over the palmar aspect, two and half centimeters total.  Macerated  Neurological:     General: No focal deficit present.     Mental Status: He is alert.     Sensory: No sensory deficit.     Motor: No weakness.  Psychiatric:        Mood and Affect: Mood normal.        Behavior: Behavior normal.      UC Treatments / Results  Labs (all labs ordered are listed, but only abnormal results are displayed) Labs Reviewed - No data to display  EKG   Radiology No results found.  Procedures Laceration Repair  Date/Time: 04/22/2020 8:29 PM Performed by: Eustace Moore, MD Authorized by: Eustace Moore, MD   Consent:    Consent obtained:  Verbal   Consent given by:  Patient   Risks discussed:  Infection, need for additional repair and poor cosmetic result Anesthesia (see MAR for exact dosages):    Anesthesia method:  Nerve block   Block needle gauge:  27 G   Block anesthetic:  Lidocaine 2% w/o epi   Block  injection procedure:  Anatomic landmarks identified   Block outcome:  Anesthesia achieved Laceration details:    Location:  Finger   Finger location:  L index finger   Length (cm):  2.5   Depth (mm):  15 Repair type:    Repair type:  Simple Pre-procedure details:    Preparation:  Patient was prepped and draped in usual sterile fashion Exploration:    Hemostasis achieved with:  Direct pressure   Wound exploration: wound explored through full range of motion     Wound extent: no nerve damage noted and no tendon damage noted     Contaminated: no   Treatment:    Area cleansed with:  Saline and Betadine   Amount of cleaning:  Standard   Irrigation solution:  Sterile saline   Irrigation volume:  10 Skin repair:    Repair method:  Sutures   Suture size:  4-0   Suture material:  Nylon   Suture technique:  Simple interrupted   Number of sutures:  4 Approximation:    Approximation:  Close Post-procedure details:    Dressing:  Antibiotic ointment and non-adherent dressing   Patient tolerance of procedure:  Tolerated well, no immediate complications Comments:     Wound was macerated.  Dead skin was trimmed away.  Skin was closed with difficulty.   (including critical care time)  Medications Ordered in UC Medications  Tdap (BOOSTRIX) injection 0.5 mL (0.5 mLs Intramuscular Given 04/22/20 2016)    Initial Impression / Assessment and Plan / UC Course  I have reviewed the triage vital signs and the nursing notes.  Pertinent labs & imaging results that were available during my care of the patient were reviewed by me and considered in my medical decision making (see chart for details).     Wound care discussed Final Clinical Impressions(s) / UC Diagnoses   Final diagnoses:  Laceration of left index finger without foreign body without damage to nail, initial encounter     Discharge Instructions     Elevate to reduce pain and swelling Take Tylenol or ibuprofen for pain Watch  for signs of infection keep  clean and reasonably dry  Return for suture removal in 10 days   ED Prescriptions    None     PDMP not reviewed this encounter.   Raylene Everts, MD 04/22/20 2031

## 2020-04-22 NOTE — ED Triage Notes (Signed)
PT cut left 2nd finger while trimming hedges today. Last tetanus unknown.

## 2021-05-27 ENCOUNTER — Telehealth: Payer: Self-pay

## 2021-05-27 NOTE — Telephone Encounter (Signed)
Notes on file.
